# Patient Record
Sex: Female | Born: 1995
Health system: Southern US, Community
[De-identification: ages and names within clinical notes are randomized; demographics above are authoritative.]

## PROBLEM LIST (undated history)

## (undated) DIAGNOSIS — J45909 Unspecified asthma, uncomplicated: Secondary | ICD-10-CM

---
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
## 2017-07-16 DIAGNOSIS — Z30015 Encounter for initial prescription of vaginal ring hormonal contraceptive: Secondary | ICD-10-CM | POA: Diagnosis not present

## 2017-07-16 DIAGNOSIS — Z0000 Encounter for general adult medical examination without abnormal findings: Secondary | ICD-10-CM | POA: Diagnosis not present

## 2017-07-16 DIAGNOSIS — Z113 Encounter for screening for infections with a predominantly sexual mode of transmission: Secondary | ICD-10-CM | POA: Diagnosis not present

## 2017-07-16 DIAGNOSIS — Z13220 Encounter for screening for lipoid disorders: Secondary | ICD-10-CM | POA: Diagnosis not present

## 2017-09-10 DIAGNOSIS — A6004 Herpesviral vulvovaginitis: Secondary | ICD-10-CM | POA: Diagnosis not present

## 2018-02-17 ENCOUNTER — Ambulatory Visit
Admission: RE | Admit: 2018-02-17 | Discharge: 2018-02-17 | Disposition: A | Discharge status: Home / Self Care | Payer: BLUE CROSS/BLUE SHIELD | Source: Ambulatory Visit | Attending: Family Medicine | Admitting: Family Medicine

## 2018-02-17 ENCOUNTER — Other Ambulatory Visit: Payer: Self-pay | Admitting: Family Medicine

## 2018-02-17 DIAGNOSIS — R200 Anesthesia of skin: Secondary | ICD-10-CM | POA: Diagnosis not present

## 2018-02-17 DIAGNOSIS — R05 Cough: Secondary | ICD-10-CM | POA: Diagnosis not present

## 2018-02-17 DIAGNOSIS — J301 Allergic rhinitis due to pollen: Secondary | ICD-10-CM | POA: Diagnosis not present

## 2018-02-17 DIAGNOSIS — J4520 Mild intermittent asthma, uncomplicated: Secondary | ICD-10-CM | POA: Diagnosis not present

## 2018-02-17 DIAGNOSIS — M545 Low back pain: Secondary | ICD-10-CM | POA: Diagnosis not present

## 2018-03-15 ENCOUNTER — Other Ambulatory Visit: Payer: Self-pay

## 2018-03-15 ENCOUNTER — Emergency Department (HOSPITAL_COMMUNITY)
Admission: EM | Admit: 2018-03-15 | Discharge: 2018-03-15 | Disposition: A | Discharge status: Home / Self Care | Payer: BLUE CROSS/BLUE SHIELD | Attending: Emergency Medicine | Admitting: Emergency Medicine

## 2018-03-15 ENCOUNTER — Emergency Department (HOSPITAL_COMMUNITY): Payer: BLUE CROSS/BLUE SHIELD

## 2018-03-15 ENCOUNTER — Encounter (HOSPITAL_COMMUNITY): Payer: Self-pay | Admitting: Emergency Medicine

## 2018-03-15 DIAGNOSIS — Z79899 Other long term (current) drug therapy: Secondary | ICD-10-CM | POA: Insufficient documentation

## 2018-03-15 DIAGNOSIS — R0602 Shortness of breath: Secondary | ICD-10-CM | POA: Insufficient documentation

## 2018-03-15 DIAGNOSIS — R0781 Pleurodynia: Secondary | ICD-10-CM | POA: Diagnosis not present

## 2018-03-15 DIAGNOSIS — J45909 Unspecified asthma, uncomplicated: Secondary | ICD-10-CM | POA: Insufficient documentation

## 2018-03-15 HISTORY — DX: Unspecified asthma, uncomplicated: J45.909

## 2018-03-15 LAB — CBC WITH DIFFERENTIAL/PLATELET
Basophils Absolute: 0 10*3/uL (ref 0.0–0.1)
Basophils Relative: 0 %
Eosinophils Absolute: 0.4 10*3/uL (ref 0.0–0.7)
Eosinophils Relative: 5 %
HCT: 39.9 % (ref 36.0–46.0)
Hemoglobin: 12.6 g/dL (ref 12.0–15.0)
Lymphocytes Relative: 26 %
Lymphs Abs: 2.1 10*3/uL (ref 0.7–4.0)
MCH: 27.2 pg (ref 26.0–34.0)
MCHC: 31.6 g/dL (ref 30.0–36.0)
MCV: 86 fL (ref 78.0–100.0)
Monocytes Absolute: 0.6 10*3/uL (ref 0.1–1.0)
Monocytes Relative: 8 %
Neutro Abs: 5 10*3/uL (ref 1.7–7.7)
Neutrophils Relative %: 61 %
Platelets: 384 10*3/uL (ref 150–400)
RBC: 4.64 MIL/uL (ref 3.87–5.11)
RDW: 13.2 % (ref 11.5–15.5)
WBC: 8.2 10*3/uL (ref 4.0–10.5)

## 2018-03-15 LAB — BASIC METABOLIC PANEL
Anion gap: 9 (ref 5–15)
BUN: 14 mg/dL (ref 6–20)
CO2: 23 mmol/L (ref 22–32)
Calcium: 8.9 mg/dL (ref 8.9–10.3)
Chloride: 103 mmol/L (ref 101–111)
Creatinine, Ser: 0.79 mg/dL (ref 0.44–1.00)
GFR calc Af Amer: >60 mL/min (ref >60)
GFR calc non Af Amer: >60 mL/min (ref >60)
Glucose, Bld: 91 mg/dL (ref 65–99)
Potassium: 4.1 mmol/L (ref 3.5–5.1)
Sodium: 135 mmol/L (ref 135–145)

## 2018-03-15 LAB — I-STAT BETA HCG BLOOD, ED (MC, WL, AP ONLY): I-stat hCG, quantitative: <5 m[IU]/mL (ref <5)

## 2018-03-15 LAB — D-DIMER, QUANTITATIVE: D-Dimer, Quant: 0.69 ug/mL-FEU — ABNORMAL HIGH (ref 0.00–0.50)

## 2018-03-15 MED ORDER — IOPAMIDOL (ISOVUE-370) INJECTION 76%
100.0000 mL | Freq: Once | INTRAVENOUS | Status: AC | PRN
  Administered 2018-03-15: 100 mL via INTRAVENOUS

## 2018-03-15 MED ORDER — DOXYCYCLINE HYCLATE 100 MG PO CAPS: 100.0000 mg | ORAL_CAPSULE | Freq: Two times a day (BID) | ORAL | 0 refills | Status: DC

## 2018-03-15 NOTE — ED Notes (Signed)
Pt transported to xray 

## 2018-03-15 NOTE — ED Triage Notes (Signed)
Pt c/o sob since yesterday with pain to anterior lower rib areas. No resp distress or sob noted. Denies injury. Denies cough

## 2018-03-15 NOTE — Discharge Instructions (Addendum)
Follow-up with your family doctor next week for recheck return sooner if problems °

## 2018-03-15 NOTE — ED Provider Notes (Signed)
[facility] EMERGENCY DEPARTMENT Provider Note   CSN: [id number] Arrival date & time: 03/15/18  1227     History   Chief Complaint Chief Complaint  Patient presents with  . Shortness of Breath    HPI Tammy Caldwell is a 21 y.o. female.  Patient complains of cough shortness of breath and pain with inspiration the left side.  No fever  The history is provided by the patient. No language interpreter was used.  Shortness of Breath  This is a new problem. The problem occurs frequently.The current episode started 2 days ago. The problem has not changed since onset.Associated symptoms include cough. Pertinent negatives include no fever, no headaches, no chest pain, no abdominal pain and no rash. It is unknown what precipitated the problem. Risk factors: None. She has tried nothing for the symptoms. The treatment provided no relief. She has had no prior hospitalizations. She has had no prior ED visits. She has had no prior ICU admissions.    Past Medical History:  Diagnosis Date  . Asthma     There are no active problems to display for this patient.   History reviewed. No pertinent surgical history.   OB History   None      Home Medications    Prior to Admission medications   Medication Sig Start Date End Date Taking? Authorizing Provider  loratadine-pseudoephedrine (CLARITIN-D 12-HOUR) 5-120 MG per tablet Take 1 tablet by mouth 2 (two) times daily as needed for allergies.   Yes [provider]  naproxen sodium (ANAPROX) 220 MG tablet Take 440 mg by mouth 2 (two) times daily as needed (headache).   Yes [provider]  NUVARING 0.12-0.015 MG/24HR vaginal ring INSERT 1 RING VAGINALLY FOR 3 WEEKS THEN REMOVE AND HAVE 1 WEEK OFF 03/13/18  Yes [provider]  doxycycline (VIBRAMYCIN) 100 MG capsule Take 1 capsule (100 mg total) by mouth 2 (two) times daily. One po bid x 7 days 03/15/18   Bethann Berkshire, MD    Family History History reviewed. No  pertinent family history.  Social History Social History   Tobacco Use  . Smoking status: Never Smoker  . Smokeless tobacco: Never Used  Substance Use Topics  . Alcohol use: Never    Frequency: Never  . Drug use: No     Allergies   Patient has no known allergies.   Review of Systems Review of Systems  Constitutional: Negative for appetite change, fatigue and fever.  HENT: Negative for congestion, ear discharge and sinus pressure.   Eyes: Negative for discharge.  Respiratory: Positive for cough and shortness of breath.   Cardiovascular: Negative for chest pain.  Gastrointestinal: Negative for abdominal pain and diarrhea.  Genitourinary: Negative for frequency and hematuria.  Musculoskeletal: Negative for back pain.  Skin: Negative for rash.  Neurological: Negative for seizures and headaches.  Psychiatric/Behavioral: Negative for hallucinations.     Physical Exam Updated Vital Signs BP (!) 127/99 (BP Location: Left Arm)   Pulse (!) 111   Temp 98.9 F (37.2 C) (Oral)   Resp 19   Ht 5' 5.5" (1.664 m)   Wt 74.8 kg (165 lb)   LMP 03/11/2018   SpO2 100%   BMI 27.04 kg/m   Physical Exam  Constitutional: She is oriented to person, place, and time. She appears well-developed.  HENT:  Head: Normocephalic.  Eyes: Conjunctivae and EOM are normal. No scleral icterus.  Neck: Neck supple. No thyromegaly present.  Cardiovascular: Normal rate and regular rhythm. Exam  reveals no gallop and no friction rub.  No murmur heard. Pulmonary/Chest: No stridor. She has no wheezes. She has no rales. She exhibits no tenderness.  Abdominal: She exhibits no distension. There is no tenderness. There is no rebound.  Musculoskeletal: Normal range of motion. She exhibits no edema.  Lymphadenopathy:    She has no cervical adenopathy.  Neurological: She is oriented to person, place, and time. She exhibits normal muscle tone. Coordination normal.  Skin: No rash noted. No erythema.    Psychiatric: She has a normal mood and affect. Her behavior is normal.     ED Treatments / Results  Labs (all labs ordered are listed, but only abnormal results are displayed) Labs Reviewed  D-DIMER, QUANTITATIVE (NOT [facility]) - Abnormal; Notable for the following components:      Result Value   D-Dimer, Quant 0.69 (*)    All other components within normal limits  CBC WITH DIFFERENTIAL/PLATELET  BASIC METABOLIC PANEL  I-STAT BETA HCG BLOOD, ED ([facility], [facility], [facility] ONLY)    EKG EKG Interpretation  Date/Time:  Monday March 15 2018 13:01:38 EDT Ventricular Rate:  98 PR Interval:    QRS Duration: 87 QT Interval:  326 QTC Calculation: 417 R Axis:   55 Text Interpretation:  Sinus rhythm Borderline T abnormalities, diffuse leads Baseline wander in lead(s) V2 Confirmed by Bethann BerkshireZammit, Cariann Kinnamon [phone]([id number]) on 03/15/2018 3:16:53 PM   Radiology Dg Chest 2 View  Result Date: 03/15/2018 CLINICAL DATA:  Left lower ribcage pain with no known injury. Symptoms are positional. The patient also reports shortness of breath. Nonsmoker. EXAM: CHEST - 2 VIEW COMPARISON:  Chest x-ray of July 03, 2015 FINDINGS: The lungs are adequately inflated. There is no focal infiltrate. There is no pleural effusion. The heart and pulmonary vascularity are normal. The mediastinum is normal in width. The trachea is midline. The bony thorax exhibits no acute abnormality. IMPRESSION: There is no pneumonia nor other acute cardiopulmonary abnormality. The bony thorax is unremarkable where visualized. Electronically Signed   By: David  SwazilandJordan M.D.   On: 03/15/2018 13:33   Ct Angio Chest Pe W And/or Wo Contrast  Result Date: 03/15/2018 CLINICAL DATA:  Anterior left lower rib pain and shortness of breath. Evaluate for pulmonary embolism. EXAM: CT ANGIOGRAPHY CHEST WITH CONTRAST TECHNIQUE: Multidetector CT imaging of the chest was performed using the standard protocol during bolus administration of intravenous contrast. Multiplanar CT image  reconstructions and MIPs were obtained to evaluate the vascular anatomy. CONTRAST:  100mL ISOVUE-370 IOPAMIDOL (ISOVUE-370) INJECTION 76% COMPARISON:  Chest radiograph-earlier same day FINDINGS: Vascular Findings: There is adequate opacification of the pulmonary arterial system with the main pulmonary artery measuring 378 Hounsfield units. There are no discrete filling defects within the pulmonary arterial tree to suggest pulmonary embolism. Normal caliber the main pulmonary artery. Normal heart size.  No pericardial effusion. Normal caliber of the thoracic aorta. No evidence of thoracic aortic dissection or periaortic stranding on this nongated examination. Bovine configuration of the aortic arch. The branch vessels of the aortic arch appear widely patent throughout their imaged course. Review of the MIP images confirms the above findings. ---------------------------------------------------------------------------------- Nonvascular Findings: Mediastinum/Lymph Nodes: No bulky mediastinal, hilar axillary lymphadenopathy. Minimal amount of soft tissue in the anterior mediastinum is favored to represent residual thymic tissue. Lungs/Pleura: Consolidative appearing airspace opacity within the medial infrahilar aspect the left lower lobe with associated air bronchograms (axial image 88, series 7; sagittal image 116, series 9; coronal image 61, series 8). No pleural effusion or pneumothorax.  The central pulmonary airways appear widely patent. No discrete pulmonary nodules. Upper abdomen: Limited early arterial phase evaluation of the upper abdomen demonstrates 2 splenules about the splenic hilum. Musculoskeletal: No acute or aggressive osseous abnormalities with special attention paid to the right-sided ribs. Regional soft tissues appear normal. Normal appearance of the thyroid gland. IMPRESSION: 1. No evidence of pulmonary embolism. 2. Findings worrisome for developing pneumonia within the medial aspect of the left  lower lobe, not readily apparent, even in hind site, on preceding chest radiograph performed earlier today. Electronically Signed   By: Simonne Come M.D.   On: 03/15/2018 17:49    Procedures Procedures (including critical care time)  Medications Ordered in ED Medications  iopamidol (ISOVUE-370) 76 % injection 100 mL (100 mLs Intravenous Contrast Given 03/15/18 1728)     Initial Impression / Assessment and Plan / ED Course  I have reviewed the triage vital signs and the nursing notes.  Pertinent labs & imaging results that were available during my care of the patient were reviewed by me and considered in my medical decision making (see chart for details).     CT scan is suggesting pneumonia.  Patient will be placed on antibiotics and told to follow-up with her doctor  Final Clinical Impressions(s) / ED Diagnoses   Final diagnoses:  SOB (shortness of breath)    ED Discharge Orders        Ordered    doxycycline (VIBRAMYCIN) 100 MG capsule  2 times daily     03/15/18 1842       Bethann Berkshire, MD 03/15/18 1845

## 2018-03-23 DIAGNOSIS — J301 Allergic rhinitis due to pollen: Secondary | ICD-10-CM | POA: Diagnosis not present

## 2018-03-23 DIAGNOSIS — J4520 Mild intermittent asthma, uncomplicated: Secondary | ICD-10-CM | POA: Diagnosis not present

## 2018-03-23 DIAGNOSIS — J181 Lobar pneumonia, unspecified organism: Secondary | ICD-10-CM | POA: Diagnosis not present

## 2018-09-05 ENCOUNTER — Emergency Department (HOSPITAL_COMMUNITY)
Admission: EM | Admit: 2018-09-05 | Discharge: 2018-09-05 | Disposition: A | Discharge status: Home / Self Care | Payer: BLUE CROSS/BLUE SHIELD | Attending: Emergency Medicine | Admitting: Emergency Medicine

## 2018-09-05 ENCOUNTER — Other Ambulatory Visit: Payer: Self-pay

## 2018-09-05 ENCOUNTER — Encounter (HOSPITAL_COMMUNITY): Payer: Self-pay | Admitting: Emergency Medicine

## 2018-09-05 DIAGNOSIS — J45909 Unspecified asthma, uncomplicated: Secondary | ICD-10-CM | POA: Diagnosis not present

## 2018-09-05 DIAGNOSIS — N938 Other specified abnormal uterine and vaginal bleeding: Secondary | ICD-10-CM | POA: Diagnosis not present

## 2018-09-05 DIAGNOSIS — Z79899 Other long term (current) drug therapy: Secondary | ICD-10-CM | POA: Diagnosis not present

## 2018-09-05 DIAGNOSIS — N939 Abnormal uterine and vaginal bleeding, unspecified: Secondary | ICD-10-CM | POA: Diagnosis not present

## 2018-09-05 LAB — CBC WITH DIFFERENTIAL/PLATELET
Basophils Absolute: 0 10*3/uL (ref 0.0–0.1)
Basophils Relative: 0 %
Eosinophils Absolute: 0.8 10*3/uL — ABNORMAL HIGH (ref 0.0–0.7)
Eosinophils Relative: 8 %
HCT: 40.8 % (ref 36.0–46.0)
Hemoglobin: 13.6 g/dL (ref 12.0–15.0)
Lymphocytes Relative: 28 %
Lymphs Abs: 2.8 10*3/uL (ref 0.7–4.0)
MCH: 28.5 pg (ref 26.0–34.0)
MCHC: 33.3 g/dL (ref 30.0–36.0)
MCV: 85.5 fL (ref 78.0–100.0)
Monocytes Absolute: 0.7 10*3/uL (ref 0.1–1.0)
Monocytes Relative: 7 %
Neutro Abs: 5.7 10*3/uL (ref 1.7–7.7)
Neutrophils Relative %: 57 %
Platelets: 321 10*3/uL (ref 150–400)
RBC: 4.77 MIL/uL (ref 3.87–5.11)
RDW: 13.3 % (ref 11.5–15.5)
WBC: 10 10*3/uL (ref 4.0–10.5)

## 2018-09-05 LAB — WET PREP, GENITAL
Clue Cells Wet Prep HPF POC: NONE SEEN
Sperm: NONE SEEN
Trich, Wet Prep: NONE SEEN
WBC, Wet Prep HPF POC: NONE SEEN
YEAST WET PREP: NONE SEEN

## 2018-09-05 LAB — HCG, QUANTITATIVE, PREGNANCY: hCG, Beta Chain, Quant, S: <1 m[IU]/mL (ref <5)

## 2018-09-05 NOTE — ED Triage Notes (Signed)
Pt states she has been having bright red vaginal bleeding for 12 days. Denies new birth control. States her period was scheduled to come at the end of the month and normally lasts 7 days. Home pregnancy test negative.

## 2018-09-05 NOTE — ED Notes (Signed)
Patient tearful in waiting room. Pt asked "why am I not brought back before the people coming in after me? Where did that man go that came in after me? I'm bleeding". I told her that the patient who came in after her had a more acute problem going on and that we had a full house right now, we would get her back as soon as we could. I gave patient a warm blanket and a menstrual pad that she could apply until we could get her into a room to be seen.

## 2018-09-05 NOTE — ED Provider Notes (Signed)
[facility] EMERGENCY DEPARTMENT Provider Note   CSN: [id number] Arrival date & time: 09/05/18  1928     History   Chief Complaint Chief Complaint  Patient presents with  . Vaginal Bleeding    HPI Tammy Caldwell is a 22 y.o. female.  She has been using the nova ring for the last 2 few months without any difficulty.  She thought she got her period early last week but it continued on so she try to reapply the nova ring and is continued bleeding for 12 days plus.  Using ibuprofen to help with the cramps.  She had similar problems with her last birth control but not with using the [place].  She is done a home pregnancy test that was negative.  She feels a little more fatigued but no syncope or shortness of breath or cough.  No fevers no chills.  The history is provided by the patient.  Vaginal Bleeding  Primary symptoms include vaginal bleeding.  Primary symptoms include no discharge, no genital lesions, no dysuria. There has been no fever. This is a new problem. The current episode started more than 1 week ago. The problem occurs constantly. The problem has not changed since onset.She is not pregnant. Her LMP was weeks ago. Associated symptoms include abdominal pain. Pertinent negatives include no constipation, no diarrhea, no nausea, no vomiting, no frequency, no light-headedness and no dizziness. She has tried NSAIDs for the symptoms. Sexual activity: sexually active. There is no concern regarding sexually transmitted diseases. She uses a vaginal ring (nuva) for contraception.    Past Medical History:  Diagnosis Date  . Asthma     There are no active problems to display for this patient.   History reviewed. No pertinent surgical history.   OB History   None      Home Medications    Prior to Admission medications   Medication Sig Start Date End Date Taking? Authorizing Provider  doxycycline (VIBRAMYCIN) 100 MG capsule Take 1 capsule (100 mg total) by mouth 2 (two) times daily.  One po bid x 7 days 03/15/18   Bethann BerkshireZammit, Joseph, MD  loratadine-pseudoephedrine (CLARITIN-D 12-HOUR) 5-120 MG per tablet Take 1 tablet by mouth 2 (two) times daily as needed for allergies.    [provider]  naproxen sodium (ANAPROX) 220 MG tablet Take 440 mg by mouth 2 (two) times daily as needed (headache).    [provider]  NUVARING 0.12-0.015 MG/24HR vaginal ring INSERT 1 RING VAGINALLY FOR 3 WEEKS THEN REMOVE AND HAVE 1 WEEK OFF 03/13/18   [provider]    Family History History reviewed. No pertinent family history.  Social History Social History   Tobacco Use  . Smoking status: Never Smoker  . Smokeless tobacco: Never Used  Substance Use Topics  . Alcohol use: Never    Frequency: Never  . Drug use: No     Allergies   Patient has no known allergies.   Review of Systems Review of Systems  Constitutional: Negative for fever.  HENT: Negative for sore throat.   Eyes: Negative for visual disturbance.  Respiratory: Negative for shortness of breath.   Cardiovascular: Negative for chest pain.  Gastrointestinal: Positive for abdominal pain. Negative for constipation, diarrhea, nausea and vomiting.  Genitourinary: Positive for vaginal bleeding. Negative for dysuria and frequency.  Musculoskeletal: Negative for neck pain.  Skin: Negative for rash.  Neurological: Negative for dizziness and light-headedness.     Physical Exam Updated Vital Signs BP (!) 163/99 (BP  Location: Right Arm)   Pulse 74   Temp 98 F (36.7 C) (Temporal)   Resp 14   Ht 5\' 6"  (1.676 m)   Wt 74.8 kg   LMP 08/24/2018   SpO2 100%   BMI 26.63 kg/m   Physical Exam  Constitutional: She appears well-developed and well-nourished. No distress.  HENT:  Head: Normocephalic and atraumatic.  Eyes: Conjunctivae are normal.  Neck: Neck supple.  Cardiovascular: Normal rate and regular rhythm.  No murmur heard. Pulmonary/Chest: Effort normal and breath sounds normal. No  respiratory distress.  Abdominal: Soft. There is no tenderness.  Genitourinary:  Genitourinary Comments: Chaperone was present during exam. Nurse. Normal external genitalia.  No masses or lesions.  Speculum exam small amount of blood in vault dark cleared with 3 scope pets.  Bleeding from cervix noted.  No other lesions noted.  Samples taken for wet prep and GC chlamydia.  Bimanual exam no cervical motion tenderness no uterine tenderness no adnexal fullness or tenderness.   Musculoskeletal: She exhibits no edema or deformity.  Neurological: She is alert.  Skin: Skin is warm and dry. Capillary refill takes less than 2 seconds.  Psychiatric: She has a normal mood and affect.  Nursing note and vitals reviewed.    ED Treatments / Results  Labs (all labs ordered are listed, but only abnormal results are displayed) Labs Reviewed  CBC WITH DIFFERENTIAL/PLATELET - Abnormal; Notable for the following components:      Result Value   Eosinophils Absolute 0.8 (*)    All other components within normal limits  WET PREP, GENITAL  HCG, QUANTITATIVE, PREGNANCY  GC/CHLAMYDIA PROBE AMP ([place]) NOT AT [facility]    EKG None  Radiology No results found.  Procedures Procedures (including critical care time)  Medications Ordered in ED Medications - No data to display   Initial Impression / Assessment and Plan / ED Course  I have reviewed the triage vital signs and the nursing notes.  Pertinent labs & imaging results that were available during my care of the patient were reviewed by me and considered in my medical decision making (see chart for details).    Final Clinical Impressions(s) / ED Diagnoses   Final diagnoses:  Dysfunctional uterine bleeding    ED Discharge Orders    None       Terrilee Files, MD 09/06/18 1031

## 2018-09-05 NOTE — Discharge Instructions (Signed)
You were evaluated in the emergency department for heavier and longer than normal vaginal bleeding.  Your pregnancy test was negative here and you were not anemic.  You should continue to take ibuprofen 3 times a day and keep well-hydrated.  It will be important for you to follow-up with your primary care doctor for further management of this.

## 2018-09-07 LAB — GC/CHLAMYDIA PROBE AMP (~~LOC~~) NOT AT ARMC
Chlamydia: NEGATIVE
Neisseria Gonorrhea: NEGATIVE

## 2018-11-25 DIAGNOSIS — F4321 Adjustment disorder with depressed mood: Secondary | ICD-10-CM | POA: Diagnosis not present

## 2018-12-01 DIAGNOSIS — F4322 Adjustment disorder with anxiety: Secondary | ICD-10-CM | POA: Diagnosis not present

## 2019-01-07 DIAGNOSIS — F4322 Adjustment disorder with anxiety: Secondary | ICD-10-CM | POA: Diagnosis not present

## 2019-06-01 DIAGNOSIS — R10813 Right lower quadrant abdominal tenderness: Secondary | ICD-10-CM | POA: Diagnosis not present

## 2019-06-01 DIAGNOSIS — Z3202 Encounter for pregnancy test, result negative: Secondary | ICD-10-CM | POA: Diagnosis not present

## 2019-06-01 DIAGNOSIS — R1031 Right lower quadrant pain: Secondary | ICD-10-CM | POA: Diagnosis not present

## 2019-12-10 IMAGING — CR DG LUMBAR SPINE COMPLETE 4+V
5 series · 5 of 5 positions shown · non-contrast
Comparison: None.

CLINICAL DATA: Lumbosacral back pain for years. Right hip and right
leg numbness when standing for long periods of time.

EXAM:
LUMBAR SPINE - COMPLETE 4+ VIEW

[t lumbar spine ap]
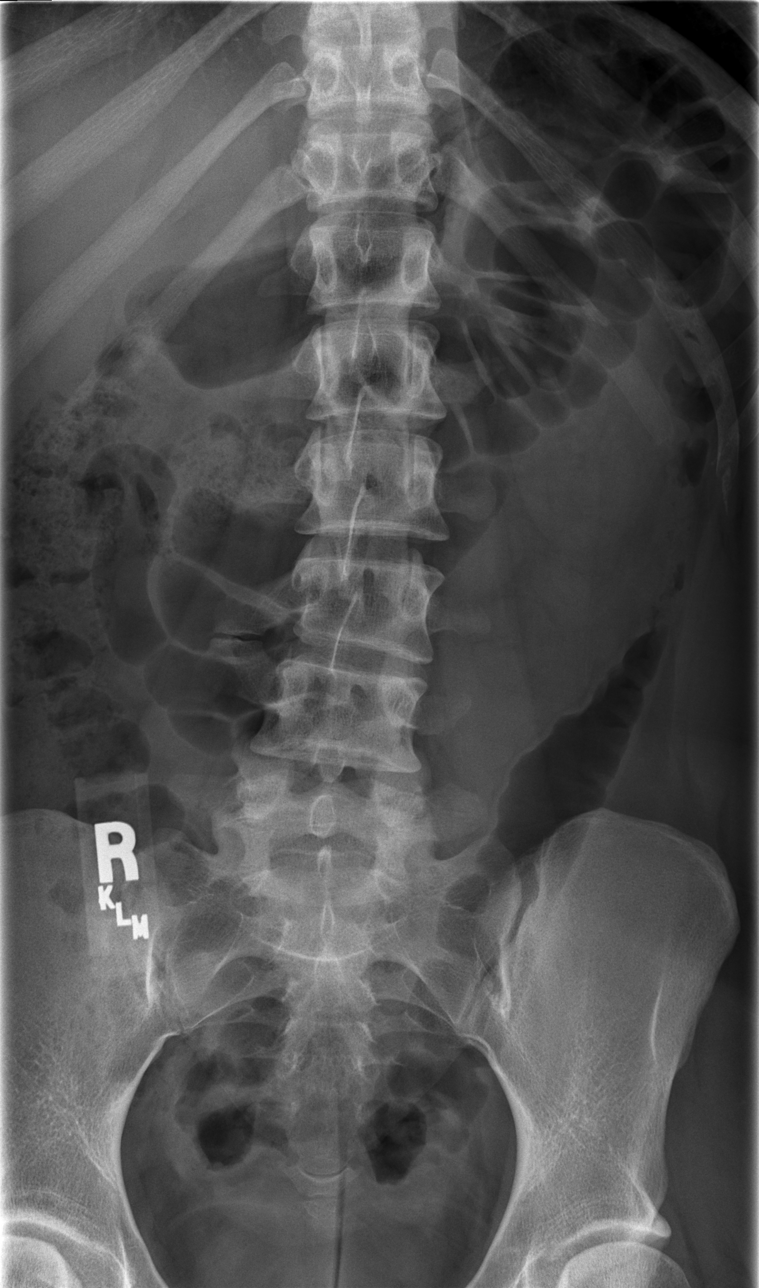

[t lumbar spine obl (1 of 2)]
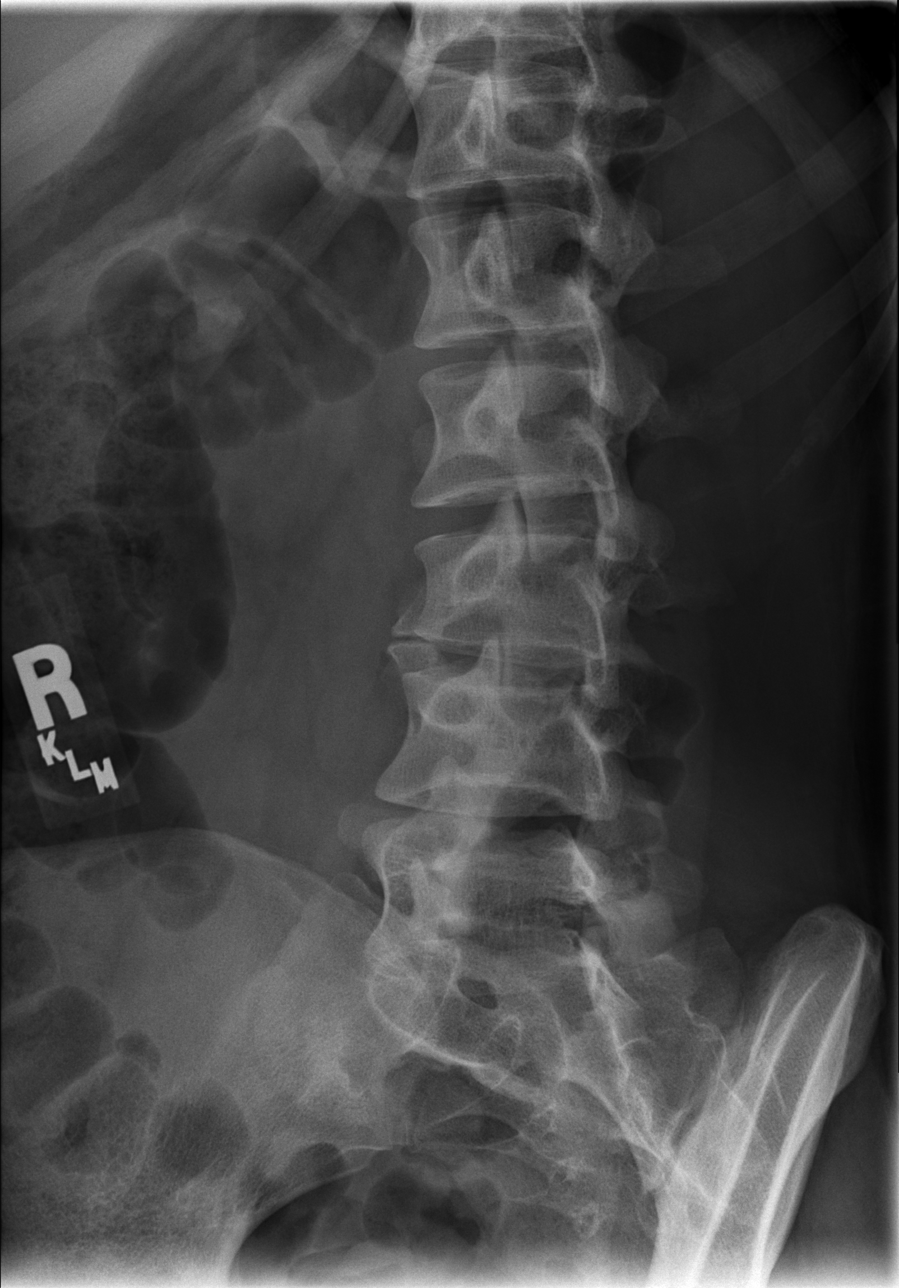

[t lumbar spine obl (2 of 2)]
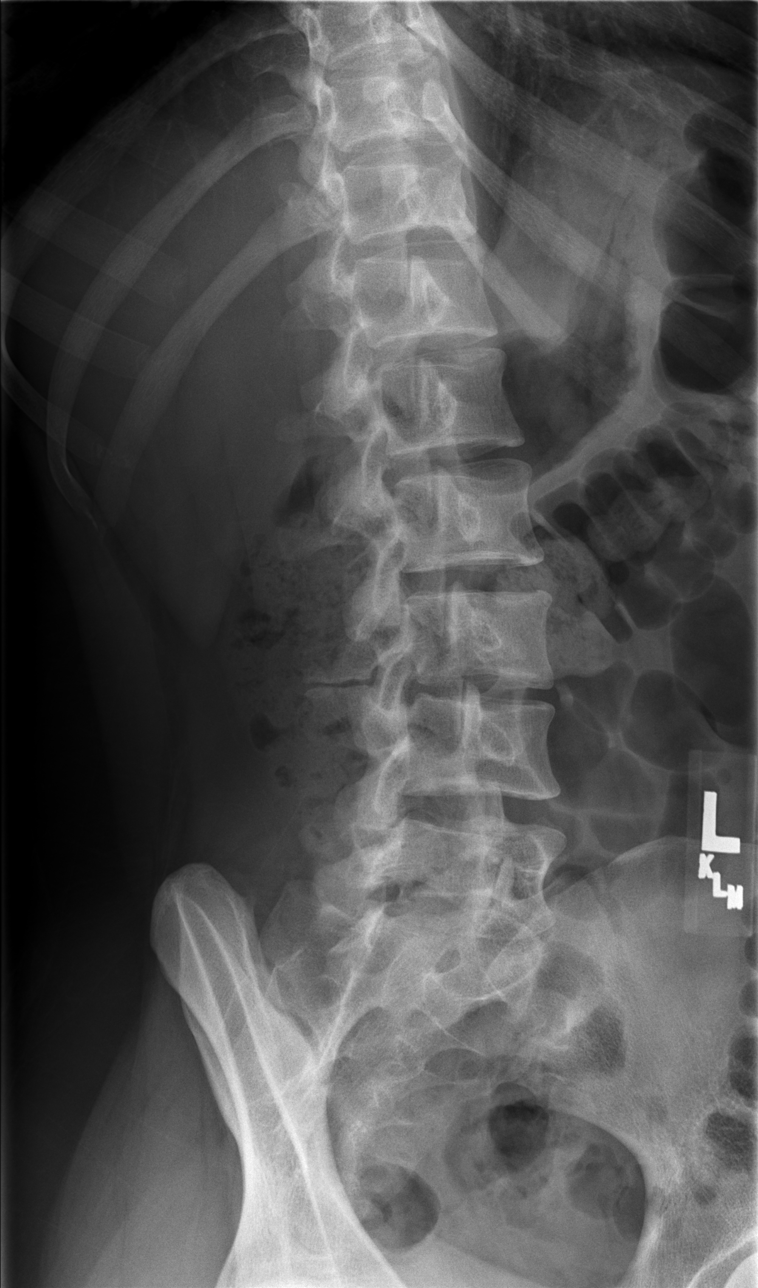

[t lumbar spine lat]
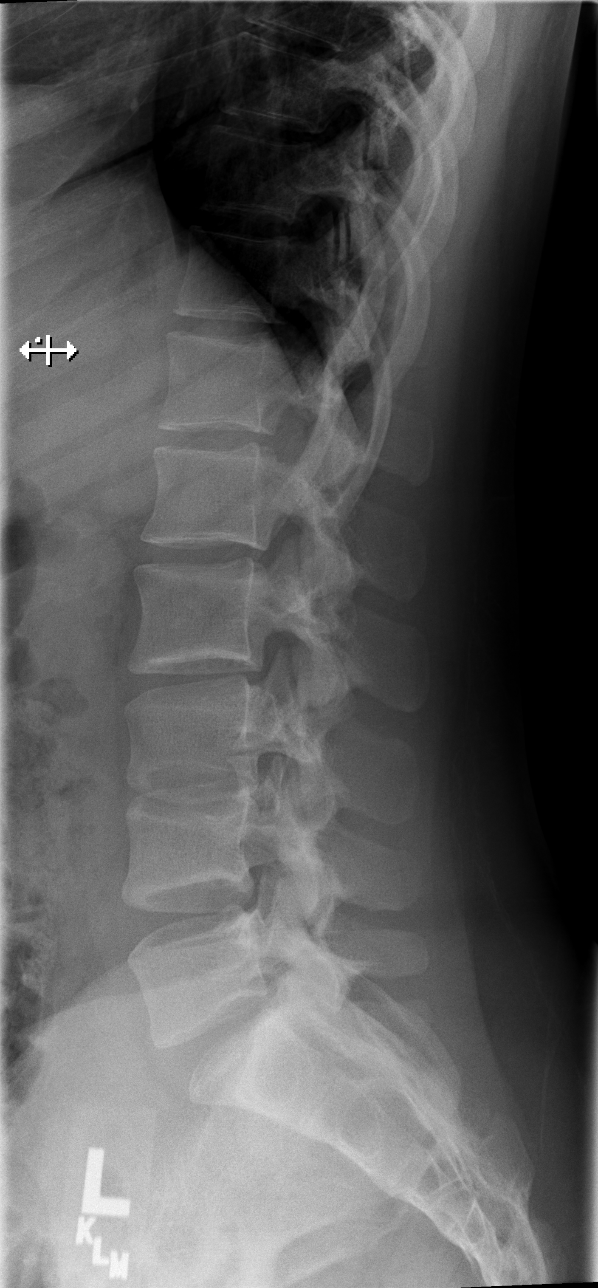

[t lumbar l-5 s-1 spot]
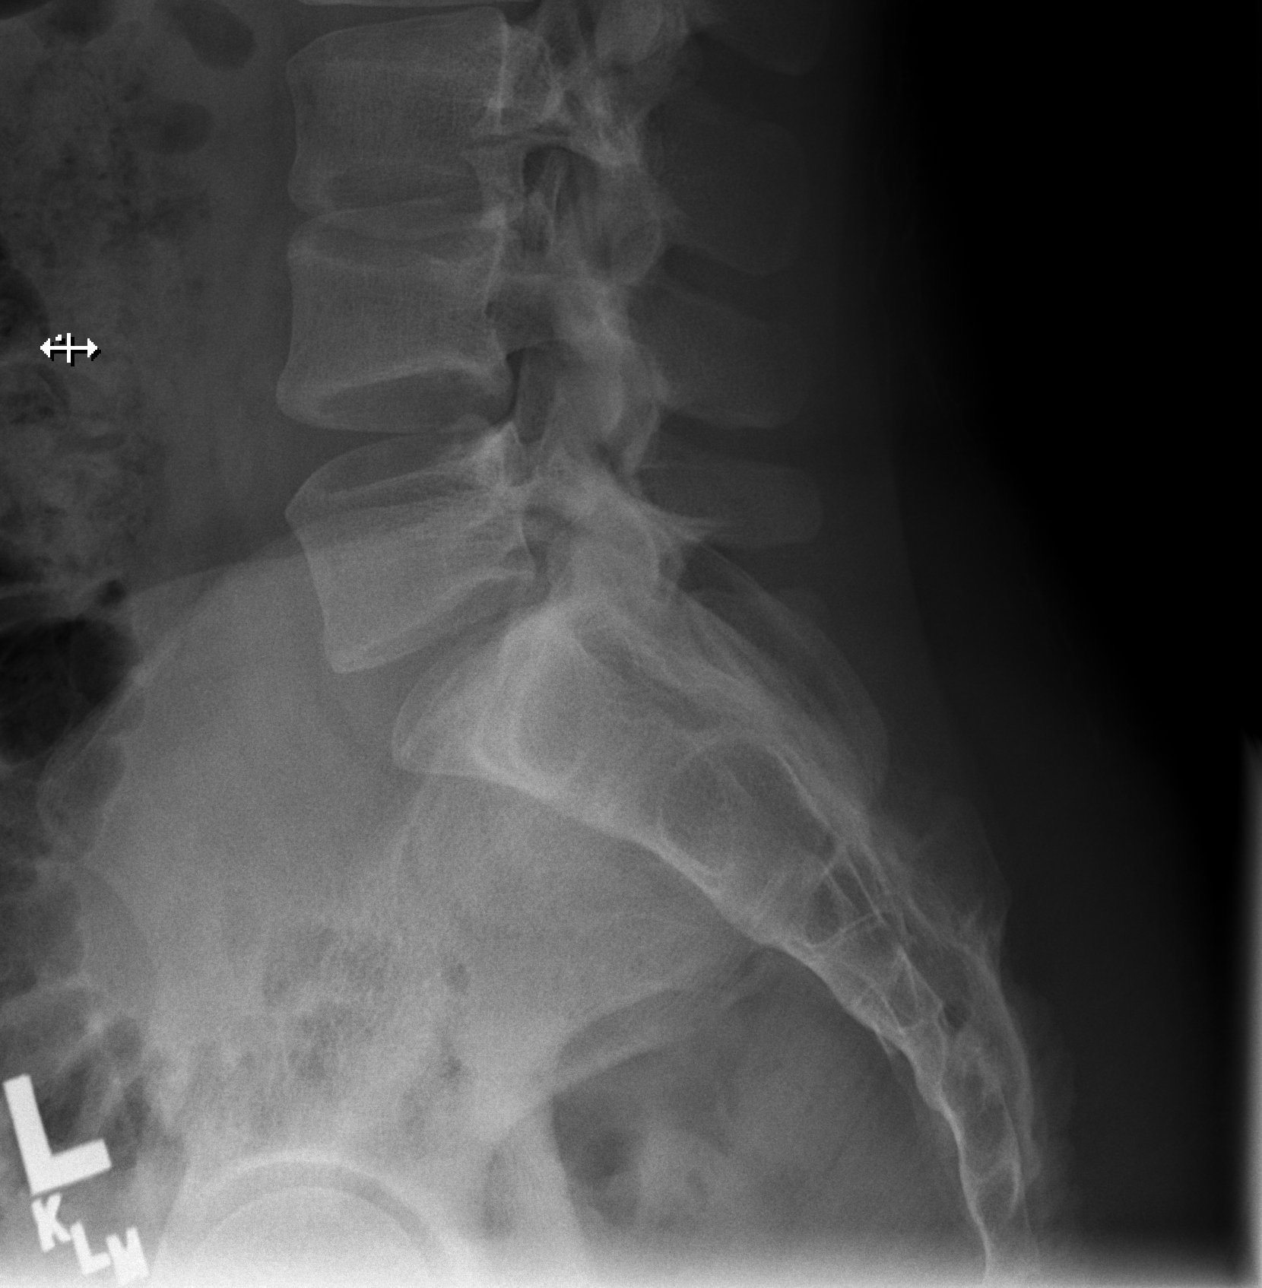

[5 of 5 positions shown; findings below may reference images not displayed]

FINDINGS: There are 6 non-rib-bearing lumbar vertebra. Lower most lumbar
vertebra will be labeled L5. 13 degrees levo scoliotic curvature of
the lumbar spine when measured from superior endplate of L1 to
inferior endplate of L3. The alignment is otherwise maintained. Disc
spaces are preserved. Vertebral body heights are normal. Sacroiliac
joints are congruent and normal. No fracture or focal lesion. No
evidence of pars defects.
IMPRESSION: Mild levo scoliotic curvature of the lumbar spine. No acute
abnormality.

## 2020-07-17 ENCOUNTER — Other Ambulatory Visit: Payer: Self-pay

## 2020-07-17 ENCOUNTER — Encounter (HOSPITAL_COMMUNITY): Payer: Self-pay | Admitting: Emergency Medicine

## 2020-07-17 ENCOUNTER — Emergency Department (HOSPITAL_COMMUNITY)
Admission: EM | Admit: 2020-07-17 | Discharge: 2020-07-17 | Disposition: A | Discharge status: Home / Self Care | Payer: BLUE CROSS/BLUE SHIELD | Attending: Emergency Medicine | Admitting: Emergency Medicine

## 2020-07-17 ENCOUNTER — Encounter (HOSPITAL_COMMUNITY): Payer: Self-pay | Admitting: *Deleted

## 2020-07-17 ENCOUNTER — Emergency Department (HOSPITAL_COMMUNITY)
Admission: EM | Admit: 2020-07-17 | Discharge: 2020-07-17 | Disposition: A | Discharge status: Home / Self Care | Payer: Self-pay | Attending: Emergency Medicine | Admitting: Emergency Medicine

## 2020-07-17 DIAGNOSIS — J45909 Unspecified asthma, uncomplicated: Secondary | ICD-10-CM | POA: Insufficient documentation

## 2020-07-17 DIAGNOSIS — Z5321 Procedure and treatment not carried out due to patient leaving prior to being seen by health care provider: Secondary | ICD-10-CM | POA: Insufficient documentation

## 2020-07-17 DIAGNOSIS — M6281 Muscle weakness (generalized): Secondary | ICD-10-CM | POA: Insufficient documentation

## 2020-07-17 DIAGNOSIS — R531 Weakness: Secondary | ICD-10-CM | POA: Insufficient documentation

## 2020-07-17 DIAGNOSIS — F419 Anxiety disorder, unspecified: Secondary | ICD-10-CM | POA: Insufficient documentation

## 2020-07-17 DIAGNOSIS — R200 Anesthesia of skin: Secondary | ICD-10-CM | POA: Insufficient documentation

## 2020-07-17 NOTE — ED Provider Notes (Signed)
[facility] EMERGENCY DEPARTMENT Provider Note   CSN: [id number] Arrival date & time: 07/17/20  1328     History Chief Complaint  Patient presents with  . Numbness    Tammy Caldwell is a 24 y.o. female.  HPI   Patient presents to the emergency department with chief complaint of weakness on the left side of her body that started last night.  She said it came on suddenly while she was laying in bed and she felt like she had some pins-and-needles on that side.  She requested EMS brought her to the emergency department but left because the wait was too long.  She comes back today stating that she has some weakness on that side but states she is overall feeling better than yesterday.  Patient denies headache, change in vision, slurring of her speech, loss of balance, difficulty moving her limbs.  She has no history of hypertension, diabetes, cardiac abnormality, strokes, DVTs, does not smoke, does not do drugs.  She has a history of anxiety as well as panic attacks and states that she has been pretty stressed out due to work.  She denies headache, fever, chills, shortness of breath, chest pain, abdominal pain, dysuria, pedal edema.  Past Medical History:  Diagnosis Date  . Asthma     There are no problems to display for this patient.   History reviewed. No pertinent surgical history.   OB History   No obstetric history on file.     No family history on file.  Social History   Tobacco Use  . Smoking status: Never Smoker  . Smokeless tobacco: Never Used  Vaping Use  . Vaping Use: Never used  Substance Use Topics  . Alcohol use: Never  . Drug use: No    Home Medications Prior to Admission medications   Medication Sig Start Date End Date Taking? Authorizing Provider  ibuprofen (ADVIL,MOTRIN) 200 MG tablet Take 400 mg by mouth every 6 (six) hours as needed for moderate pain.    [provider]  NUVARING 0.12-0.015 MG/24HR vaginal ring INSERT 1 RING VAGINALLY FOR 3  WEEKS THEN REMOVE AND HAVE 1 WEEK OFF 03/13/18   [provider]    Allergies    Patient has no known allergies.  Review of Systems   Review of Systems  Constitutional: Negative for chills and fever.  HENT: Negative for congestion, sore throat and tinnitus.   Eyes: Negative for visual disturbance.  Respiratory: Negative for cough and shortness of breath.   Cardiovascular: Negative for chest pain, palpitations and leg swelling.  Gastrointestinal: Negative for abdominal pain, diarrhea, nausea and vomiting.  Genitourinary: Negative for enuresis, vaginal bleeding and vaginal discharge.  Musculoskeletal: Negative for back pain and joint swelling.  Skin: Negative for rash.  Neurological: Positive for weakness and numbness. Negative for dizziness, facial asymmetry and headaches.  Hematological: Does not bruise/bleed easily.    Physical Exam Updated Vital Signs BP 122/77 (BP Location: Right Arm)   Pulse 77   Temp 98.6 F (37 C) (Oral)   Resp 18   Ht 5\' 7"  (1.702 m)   Wt 57.2 kg   SpO2 100%   BMI 19.73 kg/m   Physical Exam Vitals and nursing note reviewed.  Constitutional:      General: She is not in acute distress.    Appearance: Normal appearance. She is not ill-appearing or diaphoretic.  HENT:     Head: Normocephalic and atraumatic.     Nose: No congestion or rhinorrhea.  Mouth/Throat:     Mouth: Mucous membranes are moist.     Pharynx: Oropharynx is clear.  Eyes:     General: No visual field deficit or scleral icterus.    Extraocular Movements: Extraocular movements intact.     Conjunctiva/sclera: Conjunctivae normal.     Pupils: Pupils are equal, round, and reactive to light.  Cardiovascular:     Rate and Rhythm: Normal rate and regular rhythm.     Pulses: Normal pulses.     Heart sounds: No murmur heard.  No friction rub. No gallop.   Pulmonary:     Effort: Pulmonary effort is normal. No respiratory distress.     Breath sounds: No wheezing, rhonchi or  rales.  Abdominal:     General: There is no distension.     Palpations: Abdomen is soft.     Tenderness: There is no abdominal tenderness. There is no guarding.  Musculoskeletal:        General: No swelling or tenderness.  Skin:    General: Skin is warm and dry.     Findings: No rash.  Neurological:     General: No focal deficit present.     Mental Status: She is alert and oriented to person, place, and time.     GCS: GCS eye subscore is 4. GCS verbal subscore is 5. GCS motor subscore is 6.     Cranial Nerves: Cranial nerves are intact. No cranial nerve deficit or facial asymmetry.     Sensory: Sensation is intact. No sensory deficit.     Motor: Motor function is intact. No weakness or pronator drift.     Coordination: Coordination is intact. Romberg sign negative. Finger-Nose-Finger Test and Heel to [place] Test normal.     Gait: Gait is intact.  Psychiatric:        Mood and Affect: Mood normal.     ED Results / Procedures / Treatments   Labs (all labs ordered are listed, but only abnormal results are displayed) Labs Reviewed  POC URINE PREG, ED    EKG EKG Interpretation  Date/Time:  Tuesday July 17 2020 14:01:10 EDT Ventricular Rate:  78 PR Interval:  136 QRS Duration: 82 QT Interval:  364 QTC Calculation: 414 R Axis:   70 Text Interpretation: Normal sinus rhythm with sinus arrhythmia Normal ECG since last tracing no significant change Confirmed by Mancel Bale [phone]) on 07/17/2020 2:44:35 PM   Radiology No results found.  Procedures Procedures (including critical care time)  Medications Ordered in ED Medications - No data to display  ED Course  I have reviewed the triage vital signs and the nursing notes.  Pertinent labs & imaging results that were available during my care of the patient were reviewed by me and considered in my medical decision making (see chart for details).    MDM Rules/Calculators/A&P                          I have personally  reviewed all imaging, labs and have interpreted them.  On exam patient was well-appearing, showing no acute signs distress, vital signs reassuring, physical exam was benign no focal deficits noted.  I have low suspicion for CVA as patient denies headache, change in vision, slurring of speech, she has no risk factors like hypertension, history of strokes, A. fib, does not smoke nicotine products, and neuro exam did not show  neuro deficits, no gait disturbance noted.  Patient also notes that she is overall  feeling much better than yesterday.  I have low suspicion for meningitis as patient is fully vaccinated, denies neck pain, vital signs within normal limits.  Low suspicion for systemic infection as patient nontoxic-appearing, vital signs reassuring, physical exam benign, no obvious signs of infection.  Due to nontoxic appearing patient reassuring physical exam further lab work and imaging were not warranted.   Patient appears to be resting calmly bed show no acute signs distress.  Patient does not meet criteria to be admitted to the hospital.  Differential diagnosis includes panic attack versus migraines versus anxiety recommend patient follows up with her primary care provider for further evaluation management.  Patient discussed with attending who agrees assessment plan.  Patient is given at home care as well as strict return precautions.  Patient verbalized that she understood and agreed with said plan. Final Clinical Impression(s) / ED Diagnoses Final diagnoses:  Weakness    Rx / DC Orders ED Discharge Orders    None       Carroll Sage, PA-C 07/17/20 1640    Raeford Razor, MD 07/19/20 [phone]

## 2020-07-17 NOTE — Discharge Instructions (Signed)
You have been seen here for weakness of the left side, exam and vital signs look reassuring.  I recommend that you take over-the-counter pain medications for your headache like Tylenol and ibuprofen which you may take every 6 hours. please follow dosing on the back of bottle.  I also recommend that you continue your allergy medications, you may also add Flonase as this can help with nasal congestion.  I want you to follow-up with your primary care doctor in 1 week's time if symptoms are not fully resolved.  I want you to come back to the emergency department if you develop slurring of your speech, loss of balance, pins-and-needles sensation in your extremities, weakness in your extremities, severe headache, change in vision, uncontrolled nausea, vomiting, diarrhea as these symptoms require further evaluation and management.

## 2020-07-17 NOTE — ED Triage Notes (Signed)
Patient stated that her left arm became numb tonight when she was trying to sleep and that she felt that she could not lift her left arm. Patient states that she does have a history of anxiety.Patient states that her arm feels a little better now. Patient was able to lift her arm in triage with no deficits.

## 2020-07-17 NOTE — ED Triage Notes (Signed)
States she has anxiety and last night had an Episode of numbness in left arm, came here for evaluation and left because the wait was too long

## 2023-02-02 LAB — PANORAMA PRENATAL TEST FULL PANEL:PANORAMA TEST PLUS 5 ADDITIONAL MICRODELETIONS: FETAL FRACTION: 9.9

## 2024-12-06 ENCOUNTER — Encounter (HOSPITAL_COMMUNITY): Payer: Self-pay | Admitting: *Deleted

## 2024-12-06 ENCOUNTER — Ambulatory Visit (HOSPITAL_COMMUNITY): Admission: EM | Admit: 2024-12-06 | Discharge: 2024-12-06 | Disposition: A | Discharge status: Home / Self Care

## 2024-12-06 DIAGNOSIS — L2084 Intrinsic (allergic) eczema: Secondary | ICD-10-CM

## 2024-12-06 MED ORDER — TRIAMCINOLONE ACETONIDE 0.025 % EX OINT: 1.0000 | TOPICAL_OINTMENT | Freq: Two times a day (BID) | CUTANEOUS | 0 refills | Status: AC

## 2024-12-06 MED ORDER — HYDROXYZINE HCL 25 MG PO TABS: 25.0000 mg | ORAL_TABLET | Freq: Four times a day (QID) | ORAL | 0 refills | Status: AC

## 2024-12-06 NOTE — ED Triage Notes (Signed)
 Pt states she has always had eczema but she has been having a flare up X 6 months. She has been using shea butter without luck.

## 2024-12-06 NOTE — Discharge Instructions (Addendum)
 Start applying triamcinolone  cream twice daily everywhere but the face to help with eczema. You can take 1 tablet of hydroxyzine  at bedtime as needed for itching.  This can make you drowsy so do not drive, work, or drink alcohol while taking this. I also recommend Vaseline or Aquaphor periodically throughout the day. Avoid very hot showers as this can likely make your skin worse. I have provided you with an ambulatory referral to dermatology.  If you do not hear from them within the week call and schedule an appointment as soon as possible for further evaluation. Otherwise follow-up with your primary care provider or return here as needed.

## 2024-12-06 NOTE — ED Provider Notes (Signed)
 " [facility]    CSN: [id number] Arrival date & time: 12/06/24  1341      History   Chief Complaint Chief Complaint  Patient presents with   Eczema    HPI Tammy Caldwell is a 28 y.o. female.   Patient presents with severe eczema to her arms and legs.  Patient reports that she has a known history of eczema and normally uses Shea butter with relief.  Patient states that it has progressively worsened over the last 6 months and has become more painful recently.  Patient reports that she has still been applying Shea butter without any relief and occasionally takes ibuprofen with relief of her pain.  Patient states that now her send seems to have been that anytime she bumps into something it tears and causes her pain.  The history is provided by the patient and medical records.    Past Medical History:  Diagnosis Date   Asthma     There are no active problems to display for this patient.   History reviewed. No pertinent surgical history.  OB History   No obstetric history on file.      Home Medications    Prior to Admission medications  Medication Sig Start Date End Date Taking? Authorizing Provider  hydrOXYzine  (ATARAX ) 25 MG tablet Take 1 tablet (25 mg total) by mouth every 6 (six) hours. 12/06/24  Yes [name], [name], NP  levonorgestrel (MIRENA, 52 MG,) 20 MCG/DAY IUD 1 each by Intrauterine route. 09/23/23  Yes [provider]  triamcinolone  (KENALOG ) 0.025 % ointment Apply 1 Application topically 2 (two) times daily. 12/06/24  Yes [name], [name], NP  ibuprofen (ADVIL,MOTRIN) 200 MG tablet Take 400 mg by mouth every 6 (six) hours as needed for moderate pain.    [provider]  NUVARING 0.12-0.015 MG/24HR vaginal ring INSERT 1 RING VAGINALLY FOR 3 WEEKS THEN REMOVE AND HAVE 1 WEEK OFF 03/13/18   [provider]    Family History History reviewed. No pertinent family history.  Social History Social History[1]   Allergies    Other and Timothy grass pollen allergen   Review of Systems Review of Systems  Per HPI  Physical Exam Triage Vital Signs ED Triage Vitals  Encounter Vitals Group     BP 12/06/24 1506 (!) 143/71     Girls Systolic BP Percentile --      Girls Diastolic BP Percentile --      Boys Systolic BP Percentile --      Boys Diastolic BP Percentile --      Pulse Rate 12/06/24 1506 98     Resp 12/06/24 1506 14     Temp 12/06/24 1506 98.6 F (37 C)     Temp Source 12/06/24 1506 Oral     SpO2 12/06/24 1506 98 %     Weight --      Height --      Head Circumference --      Peak Flow --      Pain Score 12/06/24 1504 0     Pain Loc --      Pain Education --      Exclude from Growth Chart --    No data found.  Updated Vital Signs BP (!) 143/71 (BP Location: Right Arm)   Pulse 98   Temp 98.6 F (37 C) (Oral)   Resp 14   SpO2 98%   Visual Acuity Right Eye Distance:   Left Eye Distance:  Bilateral Distance:    Right Eye Near:   Left Eye Near:    Bilateral Near:     Physical Exam Vitals and nursing note reviewed.  Constitutional:      General: She is awake. She is not in acute distress.    Appearance: Normal appearance. She is well-developed and well-groomed. She is not ill-appearing.  Skin:    General: Skin is warm and dry.     Comments: Diffuse erythematous scaling and dryness noted to bilateral arms and legs.  Neurological:     Mental Status: She is alert.  Psychiatric:        Behavior: Behavior is cooperative.           UC Treatments / Results  Labs (all labs ordered are listed, but only abnormal results are displayed) Labs Reviewed - No data to display  EKG   Radiology No results found.  Procedures Procedures (including critical care time)  Medications Ordered in UC Medications - No data to display  Initial Impression / Assessment and Plan / UC Course  I have reviewed the triage vital signs and the nursing notes.  Pertinent labs & imaging  results that were available during my care of the patient were reviewed by me and considered in my medical decision making (see chart for details).     Patient is overall well-appearing.  Vitals are stable.  I do believe exam findings are consistent with severe eczema.  Prescribed a large amount of triamcinolone  ointment to apply twice daily to help with this.  Prescribed hydroxyzine  to help with itching.  Given ambulatory referral to dermatology.  Discussed follow-up and return precautions. Final Clinical Impressions(s) / UC Diagnoses   Final diagnoses:  Intrinsic eczema     Discharge Instructions      Start applying triamcinolone  cream twice daily everywhere but the face to help with eczema. You can take 1 tablet of hydroxyzine  at bedtime as needed for itching.  This can make you drowsy so do not drive, work, or drink alcohol while taking this. I also recommend Vaseline or Aquaphor periodically throughout the day. Avoid very hot showers as this can likely make your skin worse. I have provided you with an ambulatory referral to dermatology.  If you do not hear from them within the week call and schedule an appointment as soon as possible for further evaluation. Otherwise follow-up with your primary care provider or return here as needed.   ED Prescriptions     Medication Sig Dispense Auth. Provider   triamcinolone  (KENALOG ) 0.025 % ointment Apply 1 Application topically 2 (two) times daily. 454 g [name], NP   hydrOXYzine  (ATARAX ) 25 MG tablet Take 1 tablet (25 mg total) by mouth every 6 (six) hours. 12 tablet [name], NP      PDMP not reviewed this encounter.    [1]  Social History Tobacco Use   Smoking status: Never   Smokeless tobacco: Never  Vaping Use   Vaping status: Never Used  Substance Use Topics   Alcohol use: Yes   Drug use: No     [name] LABOR, NP 12/06/24 1547  "
